# Patient Record
Sex: Male | Born: 1995 | Race: White | Hispanic: No | Marital: Single | Smoking: Never smoker
Health system: Southern US, Community
[De-identification: ages and names within clinical notes are randomized; demographics above are authoritative.]

---
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
## 2016-04-08 DIAGNOSIS — L700 Acne vulgaris: Secondary | ICD-10-CM | POA: Insufficient documentation

## 2019-03-14 ENCOUNTER — Other Ambulatory Visit: Payer: Self-pay

## 2019-03-14 DIAGNOSIS — Z20822 Contact with and (suspected) exposure to covid-19: Secondary | ICD-10-CM

## 2019-03-16 LAB — NOVEL CORONAVIRUS, NAA

## 2019-03-17 ENCOUNTER — Other Ambulatory Visit: Payer: Self-pay

## 2019-03-17 ENCOUNTER — Encounter (HOSPITAL_COMMUNITY): Payer: Self-pay

## 2019-03-17 ENCOUNTER — Ambulatory Visit (HOSPITAL_COMMUNITY)
Admission: EM | Admit: 2019-03-17 | Discharge: 2019-03-17 | Disposition: A | Discharge status: Home / Self Care | Payer: PRIVATE HEALTH INSURANCE | Attending: Emergency Medicine | Admitting: Emergency Medicine

## 2019-03-17 DIAGNOSIS — Z20822 Contact with and (suspected) exposure to covid-19: Secondary | ICD-10-CM

## 2019-03-17 DIAGNOSIS — R05 Cough: Secondary | ICD-10-CM

## 2019-03-17 DIAGNOSIS — R519 Headache, unspecified: Secondary | ICD-10-CM | POA: Diagnosis not present

## 2019-03-17 DIAGNOSIS — Z20828 Contact with and (suspected) exposure to other viral communicable diseases: Secondary | ICD-10-CM | POA: Insufficient documentation

## 2019-03-17 DIAGNOSIS — J029 Acute pharyngitis, unspecified: Secondary | ICD-10-CM

## 2019-03-17 LAB — POC SARS CORONAVIRUS 2 AG: SARS Coronavirus 2 Ag: NEGATIVE

## 2019-03-17 LAB — POC SARS CORONAVIRUS 2 AG -  ED: SARS Coronavirus 2 Ag: NEGATIVE

## 2019-03-17 NOTE — Discharge Instructions (Addendum)
Your rapid COVID-19  test was negative  COVID testing ordered.  It will take between 2-7 days for test results.  Someone will contact you regarding abnormal results.    In the meantime: You should remain isolated in your home for 10 days from symptom onset AND greater than 72 hours after symptoms resolution (absence of fever without the use of fever-reducing medication and improvement in respiratory symptoms), whichever is longer Get plenty of rest and push fluids Use medications daily for symptom relief Use OTC medications like ibuprofen or tylenol as needed fever or pain Call or go to the ED if you have any new or worsening symptoms such as fever, worsening cough, shortness of breath, chest tightness, chest pain, turning blue, changes in mental status, etc..Marland Kitchen

## 2019-03-17 NOTE — ED Provider Notes (Addendum)
[place]    CSN: [id number] Arrival date & time: 03/17/19  1405      History   Chief Complaint Chief Complaint  Patient presents with  . Covid Test  . Shortness of Breath    HPI Darrell Rosario is a 23 y.o. male.   40 y old male presented to the urgent care with a complaint of headache, sore throat, congestion loss of taste and smell X 10 days ago. He stated his symptom are almost resolved and still have a mild congestion and slight cough. He stated he has used Tylenol, Zinc and Vitamin B3 and the meds have been effective. Denied chills and fever and is here to get COVID test  The history is provided by the patient. No language interpreter was used.  Shortness of Breath Associated symptoms: cough   Associated symptoms: no diaphoresis, no fever, no headaches and no sore throat     History reviewed. No pertinent past medical history.  There are no active problems to display for this patient.   History reviewed. No pertinent surgical history.     Home Medications    Prior to Admission medications   Not on File    Family History Family History  Problem Relation Age of Onset  . Cancer Mother   . Diabetes Mother   . Hypertension Father     Social History Social History   Tobacco Use  . Smoking status: Never Smoker  . Smokeless tobacco: Never Used  Substance Use Topics  . Alcohol use: Yes    Comment: weekly  . Drug use: Not on file     Allergies   Patient has no known allergies.   Review of Systems Review of Systems  Constitutional: Positive for fatigue. Negative for activity change, appetite change, diaphoresis and fever.  HENT: Positive for congestion. Negative for sinus pressure, sinus pain and sore throat.   Respiratory: Positive for cough. Negative for shortness of breath.   Neurological: Negative for dizziness and headaches.     Physical Exam Triage Vital Signs ED Triage Vitals  Enc Vitals Group     BP 03/17/19 1442 118/74      Pulse Rate 03/17/19 1442 97     Resp 03/17/19 1442 15     Temp 03/17/19 1442 98.8 F (37.1 C)     Temp Source 03/17/19 1442 Oral     SpO2 03/17/19 1442 100 %     Weight --      Height --      Head Circumference --      Peak Flow --      Pain Score 03/17/19 1438 0     Pain Loc --      Pain Edu? --      Excl. in [place]? --    No data found.  Updated Vital Signs BP 118/74 (BP Location: Left Arm)   Pulse 97   Temp 98.8 F (37.1 C) (Oral)   Resp 15   SpO2 100%   Visual Acuity Right Eye Distance:   Left Eye Distance:   Bilateral Distance:    Right Eye Near:   Left Eye Near:    Bilateral Near:     Physical Exam Vitals signs and nursing note reviewed.  Constitutional:      General: He is not in acute distress.    Appearance: Normal appearance. He is well-developed and normal weight. He is not ill-appearing or toxic-appearing.  HENT:     Head: Normocephalic.  Right Ear: Tympanic membrane, ear canal and external ear normal. There is no impacted cerumen.     Left Ear: Tympanic membrane, ear canal and external ear normal. There is no impacted cerumen.     Nose: Congestion present.     Mouth/Throat:     Mouth: Mucous membranes are moist.  Cardiovascular:     Rate and Rhythm: Normal rate and regular rhythm.     Pulses: Normal pulses.     Heart sounds: Normal heart sounds.  Pulmonary:     Effort: Pulmonary effort is normal. No respiratory distress.     Breath sounds: Normal breath sounds. No wheezing.  Chest:     Chest wall: No tenderness.  Neurological:     Mental Status: He is alert and oriented to person, place, and time.      UC Treatments / Results  Labs (all labs ordered are listed, but only abnormal results are displayed) Labs Reviewed  NOVEL CORONAVIRUS, NAA (HOSP ORDER, SEND-OUT TO REF LAB; TAT 18-24 HRS)  POC SARS CORONAVIRUS 2 AG -  ED  POC SARS CORONAVIRUS 2 AG    EKG   Radiology No results found.  Procedures Procedures (including critical  care time)  Medications Ordered in UC Medications - No data to display  Initial Impression / Assessment and Plan / UC Course  I have reviewed the triage vital signs and the nursing notes.  Pertinent labs & imaging results that were available during my care of the patient were reviewed by me and considered in my medical decision making (see chart for details).  Rapid COVID-19 test was negative and [facility] test was ordered.  Patient denied any symptom management medication as he stated he is doing well. Patient was discharge in stable condition and was advised to seek emergent care if symptom get worse Final Clinical Impressions(s) / UC Diagnoses   Final diagnoses:  Suspected COVID-19 virus infection     Discharge Instructions     Your rapid COVID-19  test was negative  COVID testing ordered.  It will take between 2-7 days for test results.  Someone will contact you regarding abnormal results.    In the meantime: You should remain isolated in your home for 10 days from symptom onset AND greater than 72 hours after symptoms resolution (absence of fever without the use of fever-reducing medication and improvement in respiratory symptoms), whichever is longer Get plenty of rest and push fluids Use medications daily for symptom relief Use OTC medications like ibuprofen or tylenol as needed fever or pain Call or go to the ED if you have any new or worsening symptoms such as fever, worsening cough, shortness of breath, chest tightness, chest pain, turning blue, changes in mental status, etc...    ED Prescriptions    None     PDMP not reviewed this encounter.   Durward Parcel, FNP 03/17/19 1542    Durward Parcel, FNP 03/17/19 [phone]

## 2019-03-17 NOTE — ED Triage Notes (Signed)
Pt here for COVID testing after his last one came back "inconclusive", pt has been having SOB, several exposures.

## 2019-03-18 LAB — NOVEL CORONAVIRUS, NAA (HOSP ORDER, SEND-OUT TO REF LAB; TAT 18-24 HRS): SARS-CoV-2, NAA: NOT DETECTED

## 2019-08-22 ENCOUNTER — Other Ambulatory Visit: Payer: Self-pay

## 2019-08-22 ENCOUNTER — Ambulatory Visit (HOSPITAL_COMMUNITY)
Admission: EM | Admit: 2019-08-22 | Discharge: 2019-08-22 | Disposition: A | Discharge status: Home / Self Care | Payer: PRIVATE HEALTH INSURANCE | Attending: Family Medicine | Admitting: Family Medicine

## 2019-08-22 DIAGNOSIS — J029 Acute pharyngitis, unspecified: Secondary | ICD-10-CM | POA: Diagnosis present

## 2019-08-22 LAB — POCT RAPID STREP A: Streptococcus, Group A Screen (Direct): NEGATIVE

## 2019-08-22 MED ORDER — IBUPROFEN 800 MG PO TABS: 800.0000 mg | ORAL_TABLET | Freq: Three times a day (TID) | ORAL | 0 refills | Status: DC

## 2019-08-22 NOTE — Discharge Instructions (Addendum)
Sore Throat  Your rapid strep tested Negative today. Culture pending.   Please continue Tylenol or Ibuprofen for fever and pain. May try salt water gargles, cepacol lozenges, throat spray, or OTC cold relief medicine for throat discomfort. If you also have congestion take a daily anti-histamine like Zyrtec, Claritin, and a oral decongestant (sudafed) to help with post nasal drip that may be irritating your throat.   Stay hydrated and drink plenty of fluids to keep your throat coated relieve irritation.

## 2019-08-22 NOTE — ED Triage Notes (Signed)
Sore throat x 1 week, requesting strep test

## 2019-08-23 NOTE — ED Provider Notes (Signed)
[facility]    CSN: [id number] Arrival date & time: 08/22/19  1546      History   Chief Complaint Chief Complaint  Patient presents with  . Sore Throat    HPI Darrell Rosario is a 24 y.o. male no significant past medical history presenting today for evaluation of a sore throat.  Patient has had a sore throat for approximately 1 week.  Symptoms initially improved, but worsened again recently.  Notes that a lot of his housemates have also had similar symptoms, multiple have had negative Covid tests, one his tested positive for strep.  He has had some associated congestion.  Using Zyrtec without relief.  HPI  No past medical history on file.  There are no problems to display for this patient.   No past surgical history on file.     Home Medications    Prior to Admission medications   Medication Sig Start Date End Date Taking? Authorizing Provider  ibuprofen (ADVIL) 800 MG tablet Take 1 tablet (800 mg total) by mouth 3 (three) times daily. 08/22/19   Bernie Fobes, Junius Creamer, PA-C    Family History Family History  Problem Relation Age of Onset  . Cancer Mother   . Diabetes Mother   . Hypertension Father     Social History Social History   Tobacco Use  . Smoking status: Never Smoker  . Smokeless tobacco: Never Used  Substance Use Topics  . Alcohol use: Yes    Comment: weekly  . Drug use: Not on file     Allergies   Patient has no known allergies.   Review of Systems Review of Systems  Constitutional: Negative for activity change, appetite change, chills, fatigue and fever.  HENT: Positive for congestion and sore throat. Negative for ear pain, rhinorrhea, sinus pressure and trouble swallowing.   Eyes: Negative for discharge and redness.  Respiratory: Negative for cough, chest tightness and shortness of breath.   Cardiovascular: Negative for chest pain.  Gastrointestinal: Negative for abdominal pain, diarrhea, nausea and vomiting.  Musculoskeletal:  Negative for myalgias.  Skin: Negative for rash.  Neurological: Negative for dizziness, light-headedness and headaches.     Physical Exam Triage Vital Signs ED Triage Vitals [08/22/19 1610]  Enc Vitals Group     BP 123/64     Pulse Rate 95     Resp 16     Temp 98.6 F (37 C)     Temp src      SpO2 98 %     Weight      Height      Head Circumference      Peak Flow      Pain Score 1     Pain Loc      Pain Edu?      Excl. in [place]?    No data found.  Updated Vital Signs BP 123/64   Pulse 95   Temp 98.6 F (37 C)   Resp 16   SpO2 98%   Visual Acuity Right Eye Distance:   Left Eye Distance:   Bilateral Distance:    Right Eye Near:   Left Eye Near:    Bilateral Near:     Physical Exam Vitals and nursing note reviewed.  Constitutional:      Appearance: He is well-developed.     Comments: No acute distress  HENT:     Head: Normocephalic and atraumatic.     Ears:     Comments: Bilateral ears without tenderness  to palpation of external auricle, tragus and mastoid, EAC's without erythema or swelling, TM's with good bony landmarks and cone of light. Non erythematous.     Nose: Nose normal.     Mouth/Throat:     Comments: Oral mucosa pink and moist, no tonsillar enlargement or exudate. Posterior pharynx patent and nonerythematous, no uvula deviation or swelling. Normal phonation. Eyes:     Conjunctiva/sclera: Conjunctivae normal.  Cardiovascular:     Rate and Rhythm: Normal rate.  Pulmonary:     Effort: Pulmonary effort is normal. No respiratory distress.     Comments: Breathing comfortably at rest, CTABL, no wheezing, rales or other adventitious sounds auscultated Abdominal:     General: There is no distension.  Musculoskeletal:        General: Normal range of motion.     Cervical back: Neck supple.  Skin:    General: Skin is warm and dry.  Neurological:     Mental Status: He is alert and oriented to person, place, and time.      UC Treatments / Results    Labs (all labs ordered are listed, but only abnormal results are displayed) Labs Reviewed  CULTURE, GROUP A STREP [facility])  POCT RAPID STREP A    EKG   Radiology No results found.  Procedures Procedures (including critical care time)  Medications Ordered in UC Medications - No data to display  Initial Impression / Assessment and Plan / UC Course  I have reviewed the triage vital signs and the nursing notes.  Pertinent labs & imaging results that were available during my care of the patient were reviewed by me and considered in my medical decision making (see chart for details).     Strep test negative, culture pending.  Oral exam relatively benign, do not suspect mono.  Most likely viral etiology versus postnasal drainage.  Recommending to continue symptomatic and supportive care, may add an additional medicine for congestion as trial to see if this helps to improve symptoms.  Rest and drink plenty fluids.  Discussed strict return precautions. Patient verbalized understanding and is agreeable with plan.  Final Clinical Impressions(s) / UC Diagnoses   Final diagnoses:  Sore throat     Discharge Instructions     Sore Throat  Your rapid strep tested Negative today. Culture pending.   Please continue Tylenol or Ibuprofen for fever and pain. May try salt water gargles, cepacol lozenges, throat spray, or OTC cold relief medicine for throat discomfort. If you also have congestion take a daily anti-histamine like Zyrtec, Claritin, and a oral decongestant (sudafed) to help with post nasal drip that may be irritating your throat.   Stay hydrated and drink plenty of fluids to keep your throat coated relieve irritation.    ED Prescriptions    Medication Sig Dispense Auth. Provider   ibuprofen (ADVIL) 800 MG tablet Take 1 tablet (800 mg total) by mouth 3 (three) times daily. 21 tablet Korbyn Vanes, [place] C, PA-C     PDMP not reviewed this encounter.   Janith Lima,  [place] 08/23/19 1138

## 2019-08-24 LAB — CULTURE, GROUP A STREP (THRC)

## 2023-12-02 ENCOUNTER — Ambulatory Visit (INDEPENDENT_AMBULATORY_CARE_PROVIDER_SITE_OTHER): Payer: Self-pay | Admitting: Student in an Organized Health Care Education/Training Program

## 2023-12-02 ENCOUNTER — Encounter: Payer: Self-pay | Admitting: Student in an Organized Health Care Education/Training Program

## 2023-12-02 VITALS — BP 104/63 | HR 60 | Ht 72.0 in | Wt 172.0 lb

## 2023-12-02 DIAGNOSIS — Z0000 Encounter for general adult medical examination without abnormal findings: Secondary | ICD-10-CM | POA: Insufficient documentation

## 2023-12-02 DIAGNOSIS — R634 Abnormal weight loss: Secondary | ICD-10-CM | POA: Diagnosis not present

## 2023-12-02 LAB — TSH: TSH: 2.84 u[IU]/mL (ref 0.35–5.50)

## 2023-12-02 LAB — CBC WITH DIFFERENTIAL/PLATELET
Basophils Absolute: 0 K/uL (ref 0.0–0.1)
Basophils Relative: 0.5 % (ref 0.0–3.0)
Eosinophils Absolute: 0.1 K/uL (ref 0.0–0.7)
Eosinophils Relative: 1.2 % (ref 0.0–5.0)
HCT: 42.9 % (ref 39.0–52.0)
Hemoglobin: 14.2 g/dL (ref 13.0–17.0)
Lymphocytes Relative: 42.1 % (ref 12.0–46.0)
Lymphs Abs: 2.7 K/uL (ref 0.7–4.0)
MCHC: 33.2 g/dL (ref 30.0–36.0)
MCV: 88.3 fl (ref 78.0–100.0)
Monocytes Absolute: 0.4 K/uL (ref 0.1–1.0)
Monocytes Relative: 6.7 % (ref 3.0–12.0)
Neutro Abs: 3.2 K/uL (ref 1.4–7.7)
Neutrophils Relative %: 49.5 % (ref 43.0–77.0)
Platelets: 256 K/uL (ref 150.0–400.0)
RBC: 4.86 Mil/uL (ref 4.22–5.81)
RDW: 13.9 % (ref 11.5–15.5)
WBC: 6.5 K/uL (ref 4.0–10.5)

## 2023-12-02 NOTE — Patient Instructions (Signed)
  VISIT SUMMARY: Today, you came in to establish care and discuss your gastrointestinal symptoms. We talked about your diet-related issues, recent weight loss, and your efforts to quit nicotine. We also reviewed your history of shoulder surgery and current sleep patterns.  YOUR PLAN: -GASTROINTESTINAL SYMPTOMS WITH POSSIBLE GLUTEN AND DAIRY SENSITIVITY: Your symptoms suggest that you might be sensitive to gluten and dairy. This could be causing your gastrointestinal issues and weight loss. We will test for celiac disease, which is a condition where your body reacts negatively to gluten. We will also check your electrolytes, liver enzymes, and kidney function. In the meantime, try to reduce your intake of gluten and dairy.  -NICOTINE DEPENDENCE, IN REMISSION: You have successfully quit smoking, which is great progress. Nicotine dependence means your body was used to nicotine, and quitting can cause irritability. Continue to avoid nicotine and use gum if needed for oral fixation.  -BENIGN SUBCUTANEOUS CYST, STABLE: You have a stable cyst under your skin that has been there for 15 years. It is likely a benign lipoma or sebaceous cyst, which are non-cancerous lumps. No treatment is needed unless it changes or causes discomfort.  INSTRUCTIONS: We will follow up with the results of your celiac disease antibody testing in 3-5 days. Please continue to monitor your symptoms and try to reduce gluten and dairy in your diet. If you notice any changes in the cyst or if it becomes uncomfortable, let us  know.

## 2023-12-02 NOTE — Assessment & Plan Note (Signed)
 Healthy young man.  No issues with mood.  He is working on cessation of nicotine products, doing a great job.  He has a healthy nutrition and exercise life.  I offered him STI testing, he declined.

## 2023-12-02 NOTE — Assessment & Plan Note (Signed)
 Symptoms suggest gluten and dairy sensitivity, with weight loss due to dietary changes and increased activity. Celiac disease testing is indicated given symptoms after gluten exposures. Order celiac disease antibody testing with results expected in 3-5 days. Check electrolytes, liver enzymes, and kidney function. Consider dietary modifications to reduce gluten and dairy intake.

## 2023-12-02 NOTE — Progress Notes (Signed)
 Complete physical exam  Patient: Darrell Rosario   DOB: [birth year]   28 y.o. Male  MRN: [id number]  Subjective:    Chief Complaint  Patient presents with   Establish Care    Patient would like a physical if possible     Darrell Rosario is a 28 y.o. male who presents today for a complete physical exam. He reports consuming a general diet. He generally feels well. He reports sleeping well. He does not have additional problems to discuss today.   Discussed the use of AI scribe software for clinical note transcription with the patient, who gave verbal consent to proceed.  History of Present Illness Darrell Rosario is a 28 year old male who presents for establishing care and evaluation of gastrointestinal symptoms.  He experiences gastrointestinal issues, primarily related to his diet, with occasional diarrhea after consuming dairy and gluten-rich foods. There is no significant abdominal pain, but he experiences urgency to use the bathroom. The patient reports that he has fewer gastrointestinal issues when consuming non-enriched wheat pasta from [place]. These symptoms have been more noticeable over the past couple of years.  He has experienced significant weight loss over the past year, dropping from 215-210 pounds to 175-170 pounds. He attributes this to dietary changes, including eating smaller meals and increasing physical activity, such as playing golf. He has also reduced his intake of snacks and alcohol.  He has a history of shoulder surgery in 2017 for ligament and rotator cuff issues, which has since resolved without ongoing problems. He occasionally experiences muscle soreness, which he attributes to inadequate stretching.  He is currently attempting to quit nicotine, having stopped smoking cigarettes as of Monday. He feels slightly irritable but has not experienced significant withdrawal symptoms. He previously used to vape but stopped due to health concerns.  He has some difficulty falling  asleep, typically not falling asleep until around 1 AM, but does not have issues staying asleep once he does. He generally gets about seven hours of sleep per night.  He does not take any regular medications, only using ibuprofen  as needed for soreness.   Most recent fall risk assessment:    12/02/2023   10:23 AM  Fall Risk   Falls in the past year? 1  Number falls in past yr: 1  Injury with Fall? 0  Follow up Falls evaluation completed     Most recent depression screenings:    12/02/2023   10:23 AM  PHQ 2/9 Scores  PHQ - 2 Score 0  PHQ- 9 Score 3     Patient Care Team: Jerrell Cleatus Ned, MD as PCP - General (Internal Medicine)   Outpatient Medications Prior to Visit  Medication Sig   [DISCONTINUED] ibuprofen  (ADVIL ) 800 MG tablet Take 1 tablet (800 mg total) by mouth 3 (three) times daily.   [DISCONTINUED] ISOtretinoin (ACCUTANE) 40 MG capsule Take 80 mg by mouth daily.   No facility-administered medications prior to visit.        Objective:     BP 104/63   Pulse 60   Ht 6' (1.829 m)   Wt 172 lb (78 kg)   SpO2 100%   BMI 23.33 kg/m   Physical Exam   Gen: Well-appearing young man Eyes: Normal Ears: Normal tympanic membranes bilaterally Neck: Normal thyroid, no nodules or adenopathy Heart: Regular, borderline bradycardic, no murmur Lungs: Unlabored, clear throughout, no crackles or wheezing Abd: Soft, nondistended, no organomegaly, no hernia Ext: Warm, no edema, normal joints Neuro:  Alert, conversational, full strength upper and lower extremities, normal gait and balance Psych: Appropriate mood and affect, not anxious or depressed appearing     Assessment & Plan:    Routine Health Maintenance and Physical Exam   There is no immunization history on file for this patient.  Health Maintenance  Topic Date Due   HIV Screening  Never done   Hepatitis C Screening  Never done   DTaP/Tdap/Td (1 - Tdap) Never done   Hepatitis B Vaccines (1 of 3 - 19+  3-dose series) Never done   COVID-19 Vaccine (1 - 2024-25 season) Never done   HPV VACCINES (1 - 3-dose SCDM series) Never done   INFLUENZA VACCINE  11/26/2023   Meningococcal B Vaccine  Aged Out    Discussed health benefits of physical activity, and encouraged him to engage in regular exercise appropriate for his age and condition.  Problem List Items Addressed This Visit       Unprioritized   Unintentional weight loss - Primary   Symptoms suggest gluten and dairy sensitivity, with weight loss due to dietary changes and increased activity. Celiac disease testing is indicated given symptoms after gluten exposures. Order celiac disease antibody testing with results expected in 3-5 days. Check electrolytes, liver enzymes, and kidney function. Consider dietary modifications to reduce gluten and dairy intake.      Relevant Orders   CBC with Differential/Platelet   Comprehensive metabolic panel with GFR   TSH   Lipid panel   Celiac Disease Comprehensive Panel with Reflexes   Health maintenance examination   Healthy young man.  No issues with mood.  He is working on cessation of nicotine products, doing a great job.  He has a healthy nutrition and exercise life.  I offered him STI testing, he declined.      Return in about 1 year (around 12/01/2024).     Cleatus Debby Specking, MD

## 2023-12-03 ENCOUNTER — Ambulatory Visit: Payer: Self-pay | Admitting: Student in an Organized Health Care Education/Training Program

## 2023-12-03 LAB — COMPREHENSIVE METABOLIC PANEL WITH GFR
ALT: 10 U/L (ref 0–53)
AST: 17 U/L (ref 0–37)
Albumin: 4.4 g/dL (ref 3.5–5.2)
Alkaline Phosphatase: 76 U/L (ref 39–117)
BUN: 9 mg/dL (ref 6–23)
CO2: 27 meq/L (ref 19–32)
Calcium: 9.2 mg/dL (ref 8.4–10.5)
Chloride: 105 meq/L (ref 96–112)
Creatinine, Ser: 1.06 mg/dL (ref 0.40–1.50)
GFR: 96.27 mL/min (ref >60.00)
Glucose, Bld: 72 mg/dL (ref 70–99)
Potassium: 4.4 meq/L (ref 3.5–5.1)
Sodium: 141 meq/L (ref 135–145)
Total Bilirubin: 0.4 mg/dL (ref 0.2–1.2)
Total Protein: 6.8 g/dL (ref 6.0–8.3)

## 2023-12-03 LAB — LIPID PANEL
Cholesterol: 139 mg/dL (ref 0–200)
HDL: 47.6 mg/dL (ref >39.00)
LDL Cholesterol: 76 mg/dL (ref 0–99)
NonHDL: 90.98
Total CHOL/HDL Ratio: 3
Triglycerides: 73 mg/dL (ref 0.0–149.0)
VLDL: 14.6 mg/dL (ref 0.0–40.0)

## 2023-12-03 LAB — CELIAC DISEASE COMPREHENSIVE PANEL WITH REFLEXES
(tTG) Ab, IgA: <1 U/mL
Immunoglobulin A: 173 mg/dL (ref 47–310)

## 2024-04-03 ENCOUNTER — Encounter: Payer: Self-pay | Admitting: Student in an Organized Health Care Education/Training Program

## 2024-04-03 MED ORDER — SCOPOLAMINE 1 MG/3DAYS TD PT72: MEDICATED_PATCH | TRANSDERMAL | 0 refills | Status: DC

## 2024-04-03 NOTE — Telephone Encounter (Signed)
 Pt is requesting scopalamin patches for an upcoming cruise

## 2024-04-04 ENCOUNTER — Other Ambulatory Visit: Payer: Self-pay

## 2024-04-04 MED ORDER — SCOPOLAMINE 1 MG/3DAYS TD PT72: MEDICATED_PATCH | TRANSDERMAL | 0 refills | Status: AC

## 2024-04-04 NOTE — Telephone Encounter (Signed)
Yes, please do.  Thank you.

## 2024-04-04 NOTE — Telephone Encounter (Signed)
 Can we send scopolamine  to [facility] on [address] in [place] for patient as he has a Good Rx card for this medication at this pharmacy? Patient goes on Vacation on 12/12

## 2024-12-04 ENCOUNTER — Encounter: Admitting: Student in an Organized Health Care Education/Training Program
